# Patient Record
Sex: Female | Born: 1960 | Race: White | Hispanic: No | Marital: Married
Health system: Southern US, Community
[De-identification: ages and names within clinical notes are randomized; demographics above are authoritative.]

---

## 1997-12-11 ENCOUNTER — Ambulatory Visit (HOSPITAL_COMMUNITY): Admission: RE | Admit: 1997-12-11 | Discharge: 1997-12-11 | Payer: Self-pay | Admitting: *Deleted

## 1997-12-15 ENCOUNTER — Ambulatory Visit (HOSPITAL_COMMUNITY): Admission: RE | Admit: 1997-12-15 | Discharge: 1997-12-15 | Payer: Self-pay | Admitting: *Deleted

## 1998-07-08 ENCOUNTER — Other Ambulatory Visit: Admission: RE | Admit: 1998-07-08 | Discharge: 1998-07-08 | Payer: Self-pay | Admitting: *Deleted

## 1999-07-25 ENCOUNTER — Other Ambulatory Visit: Admission: RE | Admit: 1999-07-25 | Discharge: 1999-07-25 | Payer: Self-pay | Admitting: *Deleted

## 1999-08-04 ENCOUNTER — Ambulatory Visit (HOSPITAL_COMMUNITY): Admission: RE | Admit: 1999-08-04 | Discharge: 1999-08-04 | Payer: Self-pay | Admitting: *Deleted

## 1999-08-04 ENCOUNTER — Encounter: Payer: Self-pay | Admitting: *Deleted

## 2000-07-30 ENCOUNTER — Other Ambulatory Visit: Admission: RE | Admit: 2000-07-30 | Discharge: 2000-07-30 | Payer: Self-pay | Admitting: *Deleted

## 2000-11-06 ENCOUNTER — Encounter: Admission: RE | Admit: 2000-11-06 | Discharge: 2000-11-06 | Payer: Self-pay | Admitting: *Deleted

## 2000-11-06 ENCOUNTER — Encounter: Payer: Self-pay | Admitting: *Deleted

## 2001-08-30 ENCOUNTER — Other Ambulatory Visit: Admission: RE | Admit: 2001-08-30 | Discharge: 2001-08-30 | Payer: Self-pay | Admitting: *Deleted

## 2002-08-05 ENCOUNTER — Encounter: Payer: Self-pay | Admitting: Obstetrics and Gynecology

## 2002-08-05 ENCOUNTER — Ambulatory Visit (HOSPITAL_COMMUNITY): Admission: RE | Admit: 2002-08-05 | Discharge: 2002-08-05 | Payer: Self-pay | Admitting: Obstetrics and Gynecology

## 2002-09-04 ENCOUNTER — Other Ambulatory Visit: Admission: RE | Admit: 2002-09-04 | Discharge: 2002-09-04 | Payer: Self-pay | Admitting: Obstetrics and Gynecology

## 2003-08-19 ENCOUNTER — Ambulatory Visit (HOSPITAL_COMMUNITY): Admission: RE | Admit: 2003-08-19 | Discharge: 2003-08-19 | Payer: Self-pay | Admitting: *Deleted

## 2003-08-20 ENCOUNTER — Other Ambulatory Visit: Admission: RE | Admit: 2003-08-20 | Discharge: 2003-08-20 | Payer: Self-pay | Admitting: Obstetrics and Gynecology

## 2004-09-05 ENCOUNTER — Ambulatory Visit (HOSPITAL_COMMUNITY): Admission: RE | Admit: 2004-09-05 | Discharge: 2004-09-05 | Payer: Self-pay | Admitting: *Deleted

## 2005-09-11 ENCOUNTER — Ambulatory Visit (HOSPITAL_COMMUNITY): Admission: RE | Admit: 2005-09-11 | Discharge: 2005-09-11 | Payer: Self-pay | Admitting: Obstetrics and Gynecology

## 2006-12-04 ENCOUNTER — Ambulatory Visit (HOSPITAL_COMMUNITY): Admission: RE | Admit: 2006-12-04 | Discharge: 2006-12-04 | Payer: Self-pay | Admitting: Obstetrics and Gynecology

## 2007-12-05 ENCOUNTER — Ambulatory Visit (HOSPITAL_COMMUNITY): Admission: RE | Admit: 2007-12-05 | Discharge: 2007-12-05 | Payer: Self-pay | Admitting: Obstetrics and Gynecology

## 2008-12-24 ENCOUNTER — Ambulatory Visit (HOSPITAL_COMMUNITY): Admission: RE | Admit: 2008-12-24 | Discharge: 2008-12-24 | Payer: Self-pay | Admitting: Obstetrics and Gynecology

## 2013-07-30 ENCOUNTER — Other Ambulatory Visit (HOSPITAL_COMMUNITY): Payer: Self-pay | Admitting: Obstetrics and Gynecology

## 2013-07-30 DIAGNOSIS — Z1231 Encounter for screening mammogram for malignant neoplasm of breast: Secondary | ICD-10-CM

## 2013-08-19 ENCOUNTER — Ambulatory Visit (HOSPITAL_COMMUNITY)
Admission: RE | Admit: 2013-08-19 | Discharge: 2013-08-19 | Disposition: A | Discharge status: Home / Self Care | Payer: 59 | Source: Ambulatory Visit | Attending: Obstetrics and Gynecology | Admitting: Obstetrics and Gynecology

## 2013-08-19 DIAGNOSIS — Z1231 Encounter for screening mammogram for malignant neoplasm of breast: Secondary | ICD-10-CM | POA: Insufficient documentation

## 2019-05-01 ENCOUNTER — Other Ambulatory Visit: Payer: Self-pay | Admitting: Family Medicine

## 2019-05-01 ENCOUNTER — Other Ambulatory Visit: Payer: Self-pay

## 2019-05-01 ENCOUNTER — Ambulatory Visit
Admission: RE | Admit: 2019-05-01 | Discharge: 2019-05-01 | Disposition: A | Discharge status: Home / Self Care | Payer: BC Managed Care – PPO | Source: Ambulatory Visit | Attending: Family Medicine | Admitting: Family Medicine

## 2019-05-01 DIAGNOSIS — M159 Polyosteoarthritis, unspecified: Secondary | ICD-10-CM

## 2020-03-17 ENCOUNTER — Other Ambulatory Visit: Payer: Self-pay | Admitting: Family Medicine

## 2020-03-17 ENCOUNTER — Ambulatory Visit
Admission: RE | Admit: 2020-03-17 | Discharge: 2020-03-17 | Disposition: A | Discharge status: Home / Self Care | Payer: BC Managed Care – PPO | Source: Ambulatory Visit | Attending: Family Medicine | Admitting: Family Medicine

## 2020-03-17 DIAGNOSIS — R0989 Other specified symptoms and signs involving the circulatory and respiratory systems: Secondary | ICD-10-CM

## 2020-09-14 ENCOUNTER — Other Ambulatory Visit: Payer: Self-pay | Admitting: Chiropractic Medicine

## 2020-09-14 DIAGNOSIS — M5416 Radiculopathy, lumbar region: Secondary | ICD-10-CM

## 2020-09-14 DIAGNOSIS — M545 Low back pain, unspecified: Secondary | ICD-10-CM

## 2020-10-09 ENCOUNTER — Other Ambulatory Visit: Payer: Self-pay

## 2020-10-09 ENCOUNTER — Ambulatory Visit
Admission: RE | Admit: 2020-10-09 | Discharge: 2020-10-09 | Disposition: A | Discharge status: Home / Self Care | Payer: BC Managed Care – PPO | Source: Ambulatory Visit | Attending: Chiropractic Medicine | Admitting: Chiropractic Medicine

## 2020-10-09 DIAGNOSIS — M545 Low back pain, unspecified: Secondary | ICD-10-CM

## 2020-10-09 DIAGNOSIS — M5416 Radiculopathy, lumbar region: Secondary | ICD-10-CM

## 2021-05-17 IMAGING — MR MR LUMBAR SPINE W/O CM
4 of 5 series · 24 of 48 positions shown · non-contrast
Comparison: None available.

CLINICAL DATA: Initial evaluation for low back pain with numbness
primarily involving the left lower extremity for several months.

EXAM:
MRI LUMBAR SPINE WITHOUT CONTRAST
TECHNIQUE: Multiplanar, multisequence MR imaging of the lumbar spine was
performed. No intravenous contrast was administered.

[Series 2: T2 post-contrast · sagittal · 4.0mm · 0.53mm/px · 6 of 15 slices shown]
[im 1/15]
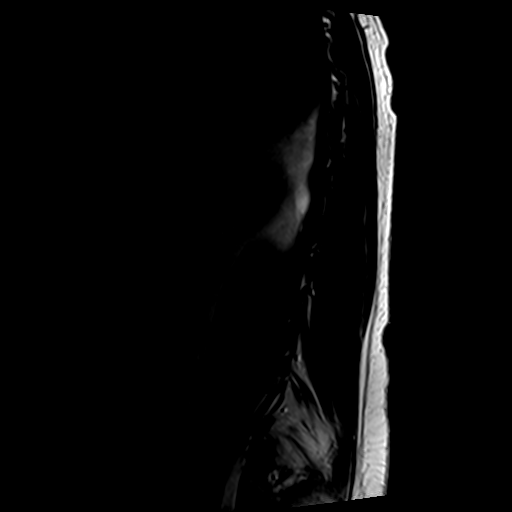
[im 3/15]
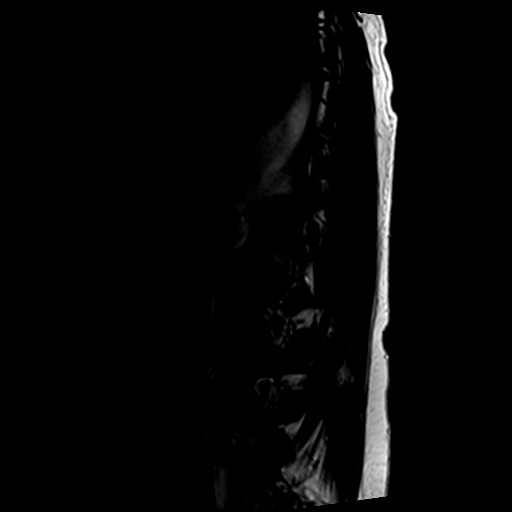
[im 6/15]
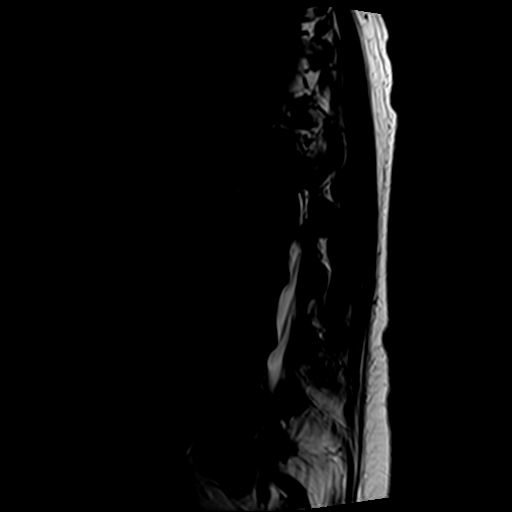
[im 9/15]
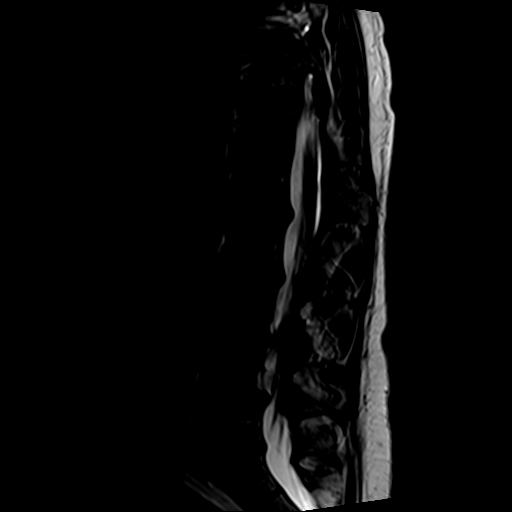
[im 12/15]
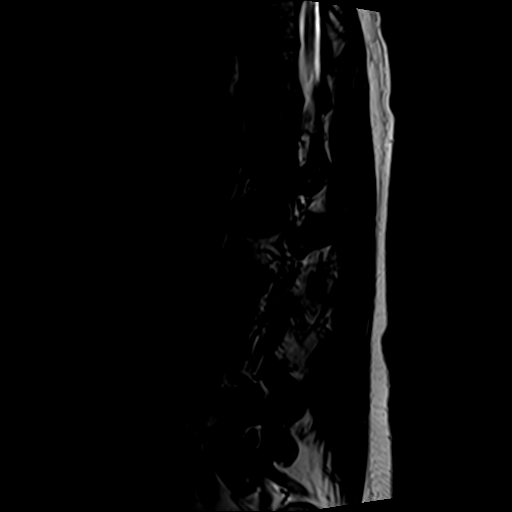
[im 15/15]
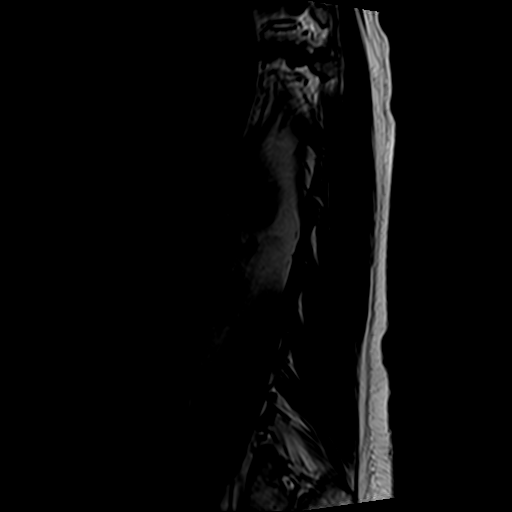

[Series 4: T1 · sagittal · 4.0mm · 0.53mm/px · 6 of 15 slices shown (1 of 2)]
[im 1/15]
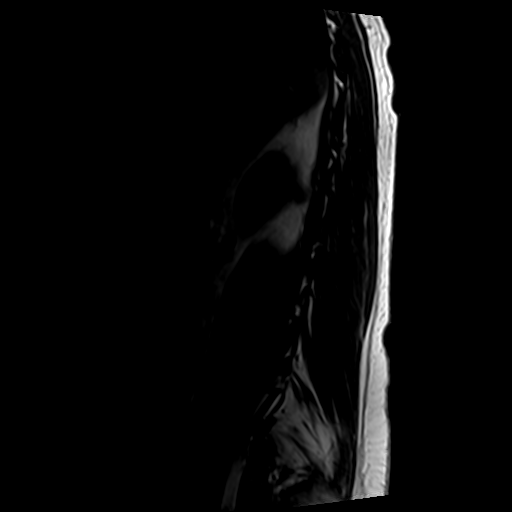
[im 3/15]
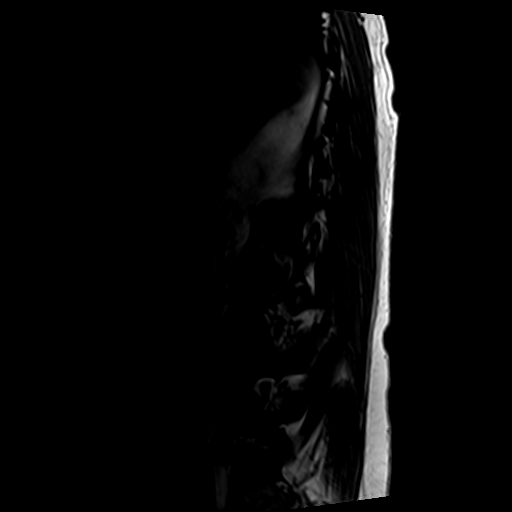
[im 6/15]
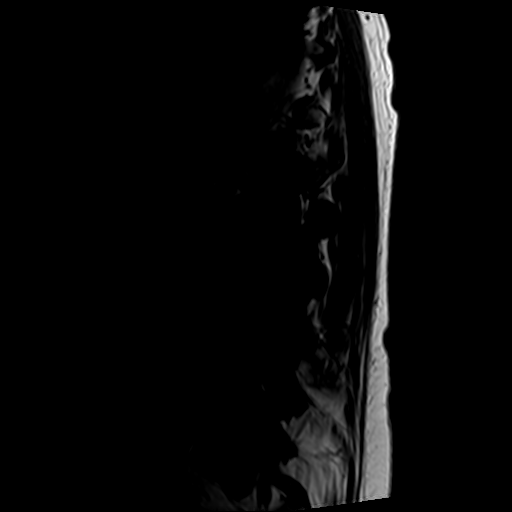
[im 9/15]
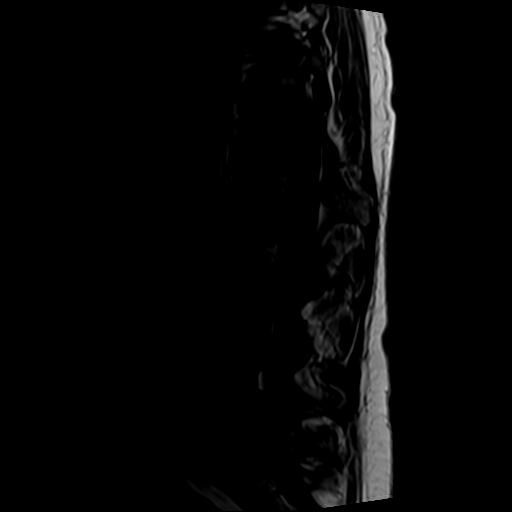
[im 12/15]
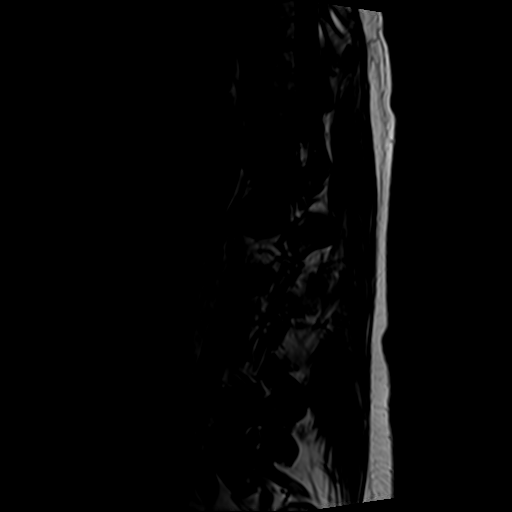
[im 15/15]
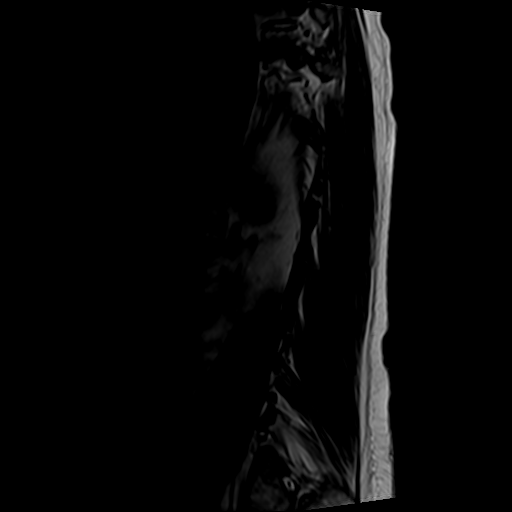

[Series 5: T2 · axial · 4.0mm · 0.70mm/px · z∈[-121,+83]mm · 9 of 37 slices shown]
[im 1/37]
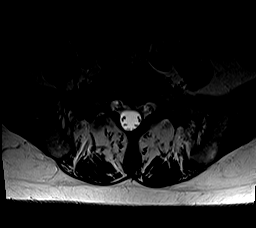
[im 6/37]
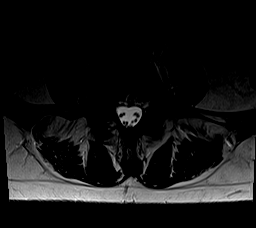
[im 11/37]
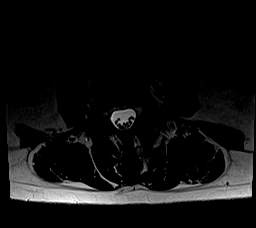
[im 16/37]
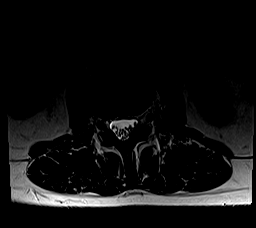
[im 19/37]
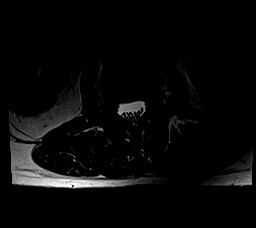
[im 21/37]
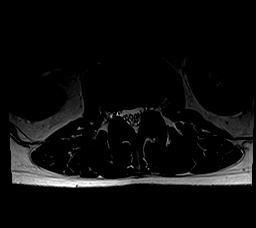
[im 26/37]
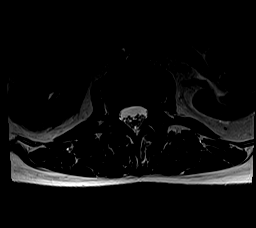
[im 31/37]
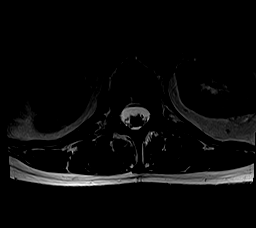
[im 37/37]
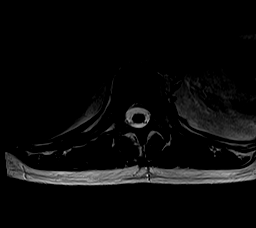

[Series 6: T1 · axial · 4.0mm · 0.35mm/px · z∈[-95,+52]mm · 3 of 37 slices shown (2 of 2)]
[im 6/37]
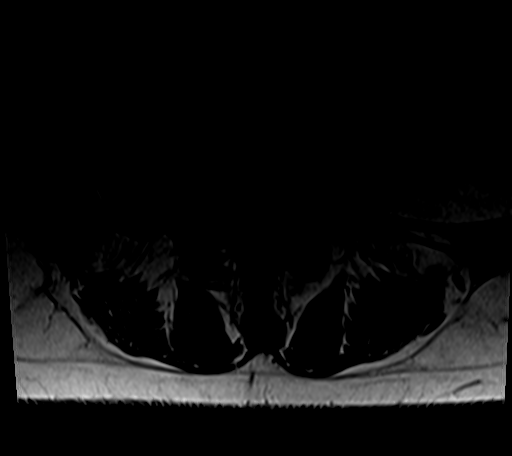
[im 19/37]
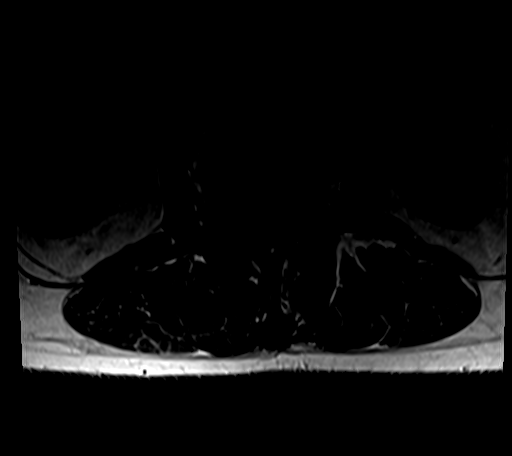
[im 31/37]
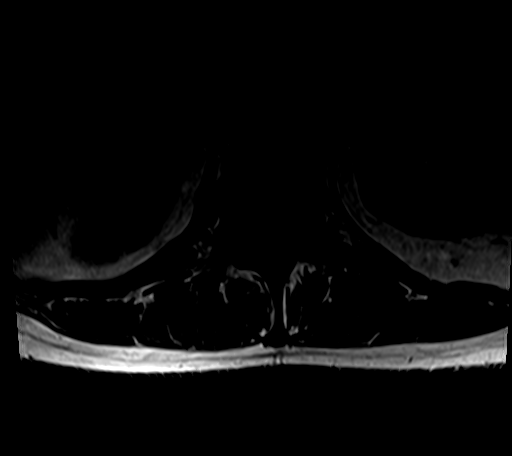

[24 of 48 positions shown; findings below may reference images not displayed]

FINDINGS: Segmentation: Standard. Lowest well-formed disc space labeled the
L5-S1 level.

Alignment: Dextroscoliosis with straightening of the normal lumbar
lordosis. 3 mm anterolisthesis of L4 on L5, chronic and facet
mediated.

Vertebrae: Vertebral body height maintained without acute or chronic
fracture. Bone marrow signal intensity mildly heterogeneous but
within normal limits. Subcentimeter benign hemangioma noted within
the L3 vertebral body. No worrisome osseous lesions. Discogenic
reactive endplate change present about the L4-5 interspace. No
abnormal marrow edema.

Conus medullaris and cauda equina: Conus extends to the L1-2 level.
Conus and cauda equina appear normal.

Paraspinal and other soft tissues: Paraspinous soft tissues within
normal limits. 1.3 cm simple exophytic cyst seen extending from the
interpolar right kidney. Visualized visceral structures otherwise
unremarkable.

Disc levels:

L1-2: Diffuse disc bulge with disc desiccation. No spinal stenosis.
Foramina remain patent.

L2-3: Circumferential disc bulge with disc desiccation. Superimposed
small central disc protrusion mildly indents the ventral thecal sac.
Mild bilateral facet hypertrophy. No significant spinal stenosis.
Foramina remain patent.

L3-4: Circumferential disc bulge with disc desiccation. Superimposed
shallow left subarticular disc protrusion partially effaces the left
lateral recess (series 5, image 23). Superimposed mild facet
hypertrophy. Resultant mild to moderate narrowing of the left
lateral recess, potentially affecting the descending left L4 nerve
root. Central canal remains patent. No significant foraminal
encroachment.

L4-5: Advanced degenerative intervertebral disc space narrowing with
disc desiccation and diffuse disc bulge. Associated discogenic
reactive endplate change with marginal endplate osteophytic
spurring, greater on the left. Moderate left greater than right
facet hypertrophy. Resultant moderate narrowing of the left lateral
recess. Mild right with mild to moderate left L4 foraminal
narrowing.

L5-S1: Mild disc bulge with disc desiccation and intervertebral disc
space narrowing. Associated reactive endplate changes on the right.
Bulging disc mildly indents the ventral thecal sac mild right
greater than left facet hypertrophy. Tiny synovial cyst noted at the
posterior aspects of both L5-S1 facets. No spinal stenosis. Moderate
right L5 foraminal narrowing. Left neural foramina remains patent.
IMPRESSION: 1. Small left subarticular disc protrusion at L3-4 with resultant
mild to moderate narrowing of the left lateral recess, potentially
affecting the descending left L4 nerve root.
2. Left eccentric disc osteophyte with facet hypertrophy at L4-5,
resulting in moderate left foraminal and left lateral recess
stenosis.
3. Moderate right L5 foraminal stenosis related to disc bulge,
reactive endplate changes, and facet degeneration.

## 2021-06-14 ENCOUNTER — Other Ambulatory Visit: Payer: Self-pay | Admitting: Family Medicine

## 2021-06-14 DIAGNOSIS — I6522 Occlusion and stenosis of left carotid artery: Secondary | ICD-10-CM

## 2021-06-24 ENCOUNTER — Other Ambulatory Visit: Payer: BC Managed Care – PPO

## 2021-06-27 ENCOUNTER — Ambulatory Visit
Admission: RE | Admit: 2021-06-27 | Discharge: 2021-06-27 | Disposition: A | Discharge status: Home / Self Care | Payer: BC Managed Care – PPO | Source: Ambulatory Visit | Attending: Family Medicine | Admitting: Family Medicine

## 2021-06-27 DIAGNOSIS — I6522 Occlusion and stenosis of left carotid artery: Secondary | ICD-10-CM

## 2022-02-02 IMAGING — US US CAROTID DUPLEX BILAT
1 series · 13 of 24 positions shown · non-contrast
Comparison: March 2020

CLINICAL DATA: Atherosclerosis of left carotid artery

EXAM:
BILATERAL CAROTID DUPLEX ULTRASOUND
TECHNIQUE: Gray scale imaging, color Doppler and duplex ultrasound were
performed of bilateral carotid and vertebral arteries in the neck.

[Series 1: us carotid duplex bilat · 0.06mm/px · 13 of 62 slices shown]
[im 1/62]
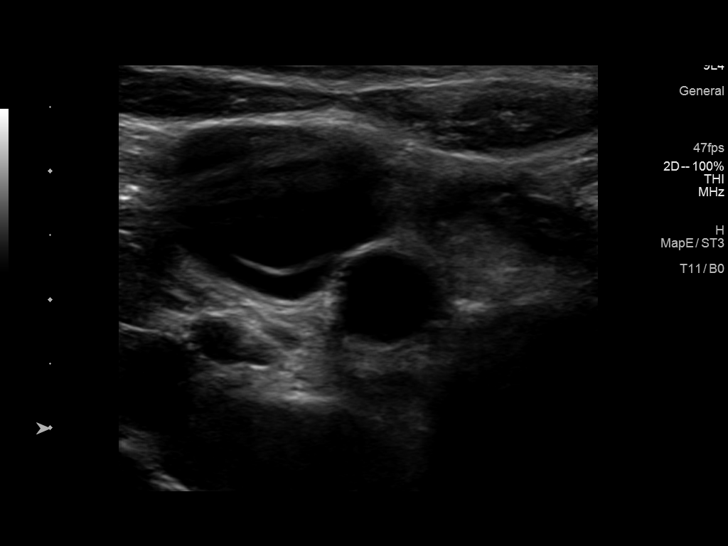
[im 6/62]
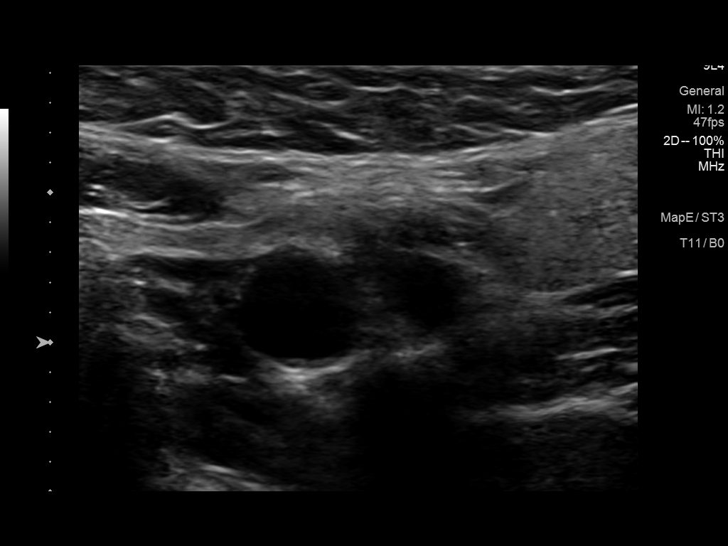
[im 11/62]
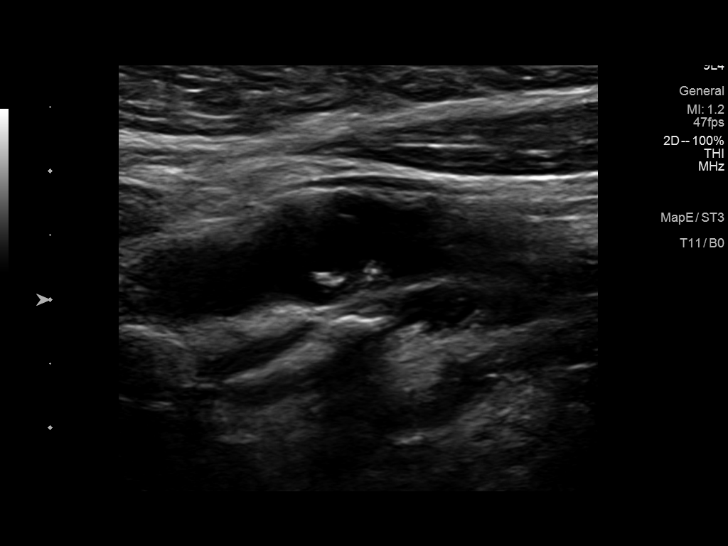
[im 16/62]
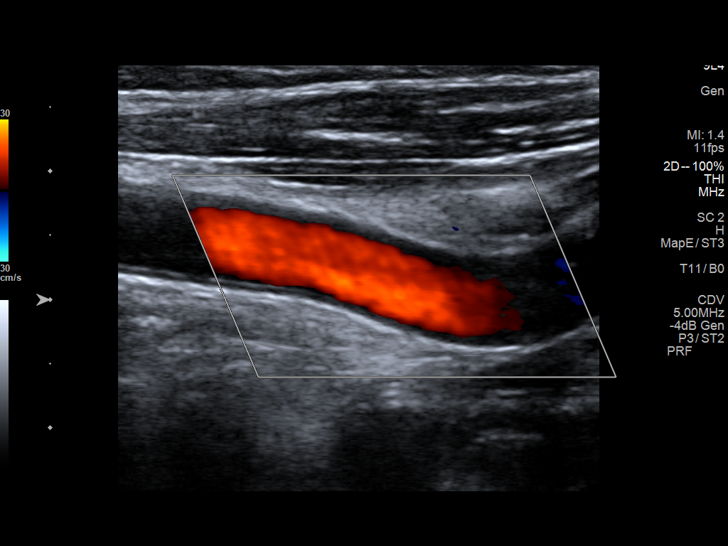
[im 22/62]
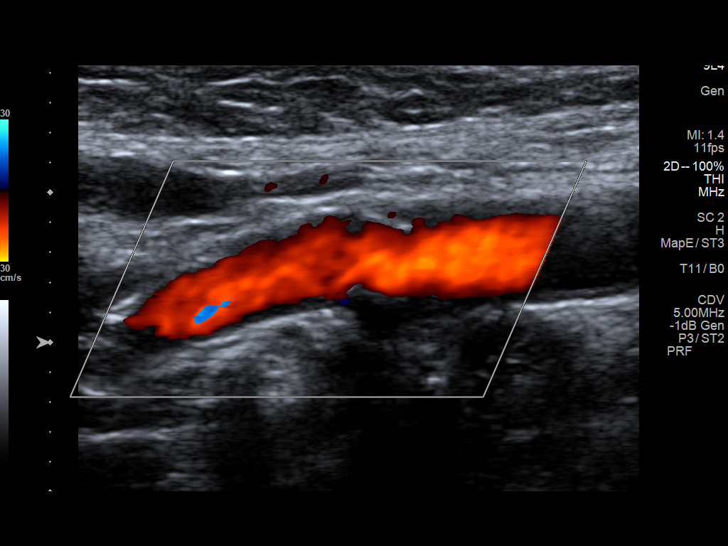
[im 27/62]
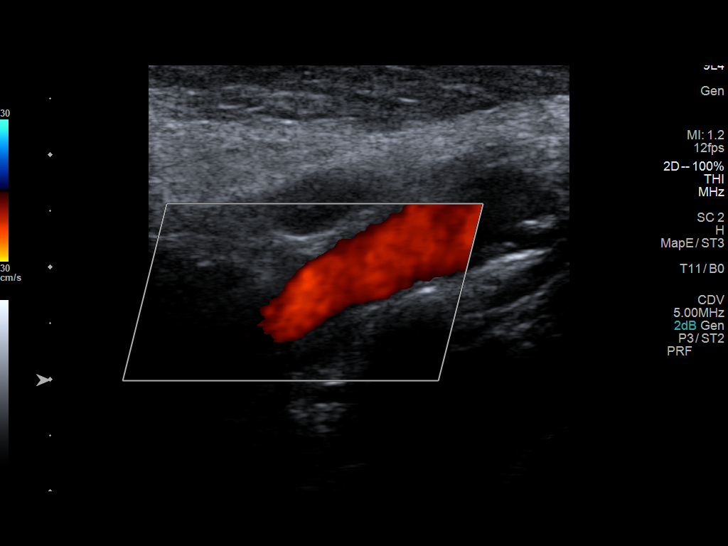
[im 32/62]
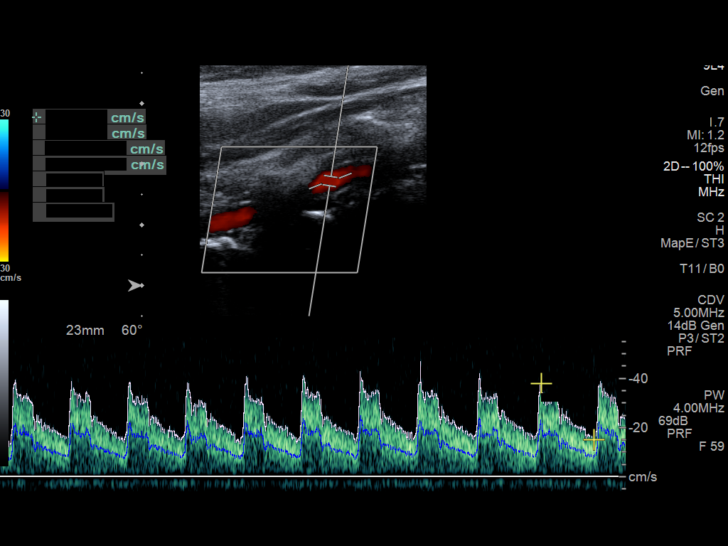
[im 35/62]
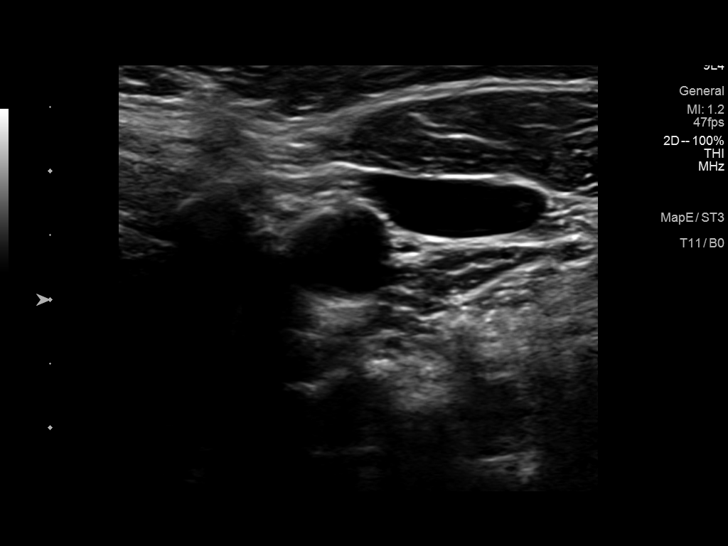
[im 40/62]
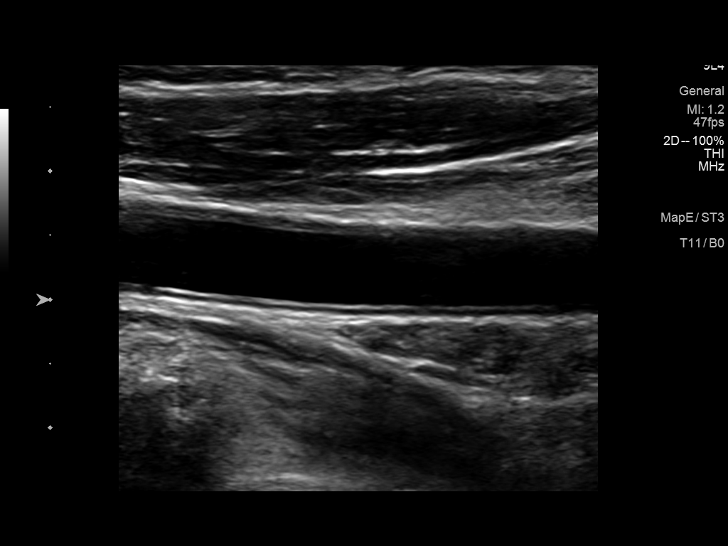
[im 46/62]
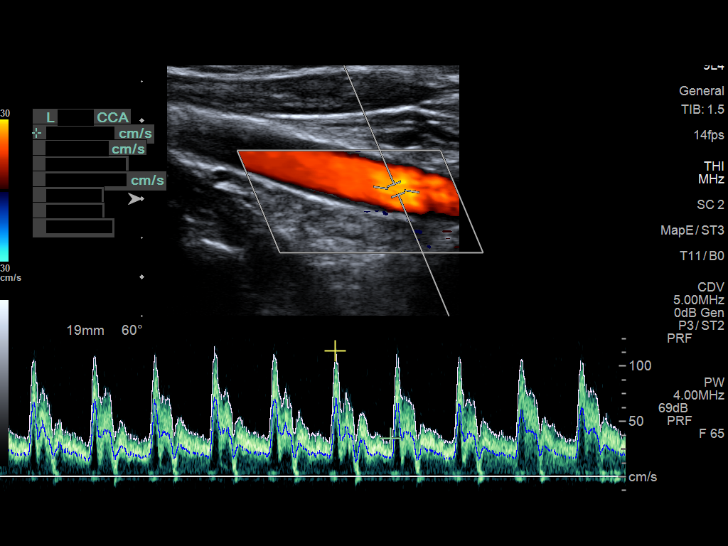
[im 51/62]
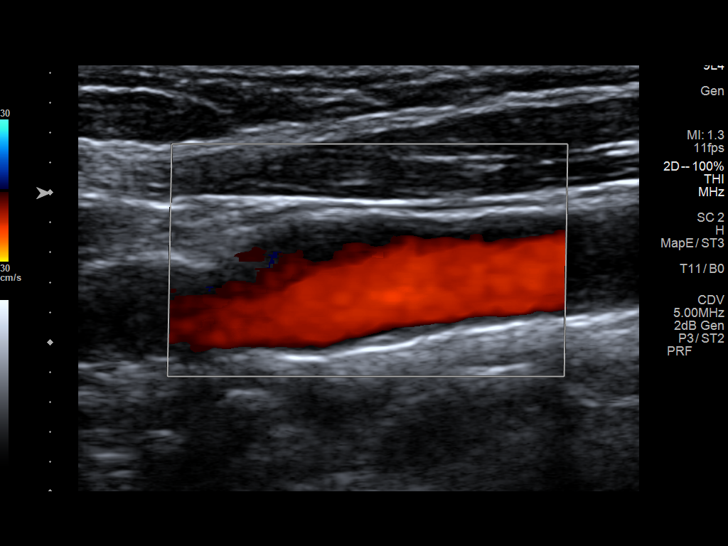
[im 56/62]
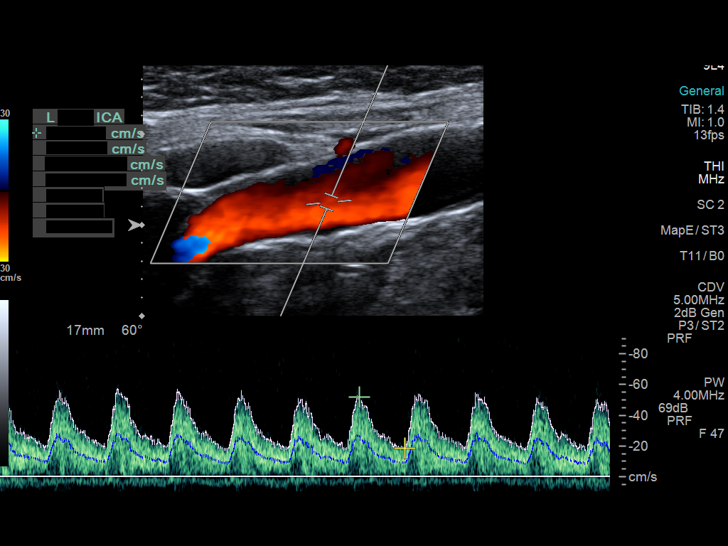
[im 62/62]
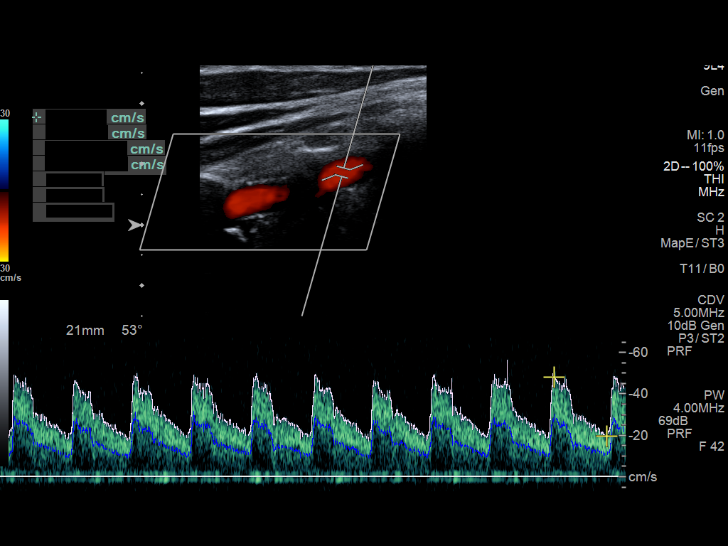

[13 of 24 positions shown; findings below may reference images not displayed]

FINDINGS: Criteria: Quantification of carotid stenosis is based on velocity
parameters that correlate the residual internal carotid diameter
with NASCET-based stenosis levels, using the diameter of the distal
internal carotid lumen as the denominator for stenosis measurement.

The following velocity measurements were obtained:

RIGHT

ICA: 87/29 cm/sec

CCA: 106/35 cm/sec

SYSTOLIC ICA/CCA RATIO:

ECA: 75 cm/sec

LEFT

ICA: 63/32 cm/sec

CCA: 114/35 cm/sec

SYSTOLIC ICA/CCA RATIO:

ECA: 68 cm/sec

RIGHT CAROTID ARTERY: Moderate atherosclerotic plaque in the carotid
bulb results in less than 50% stenosis. Normal low resistance
waveform in the internal carotid artery.

RIGHT VERTEBRAL ARTERY:  Antegrade flow

LEFT CAROTID ARTERY: Minimal atherosclerotic plaque appreciated. No
significant stenosis. Normal low resistance waveform in the internal
carotid artery.

LEFT VERTEBRAL ARTERY:  Antegrade flow

Upper extremity blood pressures: RIGHT: 113/73 LEFT: 111/66
IMPRESSION: No evidence of hemodynamically significant stenosis involving either
the right or left carotid circulation in the neck by Doppler
criteria. Moderate atherosclerotic plaque in the right carotid bulb
results in less than 50% stenosis. Minimal atherosclerotic plaque
appreciated in the left carotid circulation.

## 2022-07-04 DIAGNOSIS — M9902 Segmental and somatic dysfunction of thoracic region: Secondary | ICD-10-CM | POA: Diagnosis not present

## 2022-07-04 DIAGNOSIS — M542 Cervicalgia: Secondary | ICD-10-CM | POA: Diagnosis not present

## 2022-07-04 DIAGNOSIS — M4712 Other spondylosis with myelopathy, cervical region: Secondary | ICD-10-CM | POA: Diagnosis not present

## 2022-07-04 DIAGNOSIS — M9901 Segmental and somatic dysfunction of cervical region: Secondary | ICD-10-CM | POA: Diagnosis not present

## 2022-07-06 DIAGNOSIS — M4712 Other spondylosis with myelopathy, cervical region: Secondary | ICD-10-CM | POA: Diagnosis not present

## 2022-07-06 DIAGNOSIS — M9901 Segmental and somatic dysfunction of cervical region: Secondary | ICD-10-CM | POA: Diagnosis not present

## 2022-07-06 DIAGNOSIS — M9902 Segmental and somatic dysfunction of thoracic region: Secondary | ICD-10-CM | POA: Diagnosis not present

## 2022-07-06 DIAGNOSIS — M542 Cervicalgia: Secondary | ICD-10-CM | POA: Diagnosis not present

## 2022-07-12 DIAGNOSIS — M9903 Segmental and somatic dysfunction of lumbar region: Secondary | ICD-10-CM | POA: Diagnosis not present

## 2022-07-12 DIAGNOSIS — M62830 Muscle spasm of back: Secondary | ICD-10-CM | POA: Diagnosis not present

## 2022-07-12 DIAGNOSIS — M9904 Segmental and somatic dysfunction of sacral region: Secondary | ICD-10-CM | POA: Diagnosis not present

## 2022-07-12 DIAGNOSIS — M9905 Segmental and somatic dysfunction of pelvic region: Secondary | ICD-10-CM | POA: Diagnosis not present

## 2022-07-13 DIAGNOSIS — M9901 Segmental and somatic dysfunction of cervical region: Secondary | ICD-10-CM | POA: Diagnosis not present

## 2022-07-13 DIAGNOSIS — M9902 Segmental and somatic dysfunction of thoracic region: Secondary | ICD-10-CM | POA: Diagnosis not present

## 2022-07-13 DIAGNOSIS — M4712 Other spondylosis with myelopathy, cervical region: Secondary | ICD-10-CM | POA: Diagnosis not present

## 2022-07-13 DIAGNOSIS — M542 Cervicalgia: Secondary | ICD-10-CM | POA: Diagnosis not present

## 2022-07-20 DIAGNOSIS — M9901 Segmental and somatic dysfunction of cervical region: Secondary | ICD-10-CM | POA: Diagnosis not present

## 2022-07-20 DIAGNOSIS — M4712 Other spondylosis with myelopathy, cervical region: Secondary | ICD-10-CM | POA: Diagnosis not present

## 2022-07-20 DIAGNOSIS — M542 Cervicalgia: Secondary | ICD-10-CM | POA: Diagnosis not present

## 2022-07-20 DIAGNOSIS — M9902 Segmental and somatic dysfunction of thoracic region: Secondary | ICD-10-CM | POA: Diagnosis not present

## 2022-07-26 DIAGNOSIS — M9904 Segmental and somatic dysfunction of sacral region: Secondary | ICD-10-CM | POA: Diagnosis not present

## 2022-07-26 DIAGNOSIS — M62830 Muscle spasm of back: Secondary | ICD-10-CM | POA: Diagnosis not present

## 2022-07-26 DIAGNOSIS — M9905 Segmental and somatic dysfunction of pelvic region: Secondary | ICD-10-CM | POA: Diagnosis not present

## 2022-07-26 DIAGNOSIS — M9903 Segmental and somatic dysfunction of lumbar region: Secondary | ICD-10-CM | POA: Diagnosis not present

## 2022-08-02 DIAGNOSIS — M9904 Segmental and somatic dysfunction of sacral region: Secondary | ICD-10-CM | POA: Diagnosis not present

## 2022-08-02 DIAGNOSIS — M62830 Muscle spasm of back: Secondary | ICD-10-CM | POA: Diagnosis not present

## 2022-08-02 DIAGNOSIS — M9903 Segmental and somatic dysfunction of lumbar region: Secondary | ICD-10-CM | POA: Diagnosis not present

## 2022-08-02 DIAGNOSIS — M9905 Segmental and somatic dysfunction of pelvic region: Secondary | ICD-10-CM | POA: Diagnosis not present

## 2022-08-09 DIAGNOSIS — M9904 Segmental and somatic dysfunction of sacral region: Secondary | ICD-10-CM | POA: Diagnosis not present

## 2022-08-09 DIAGNOSIS — M9905 Segmental and somatic dysfunction of pelvic region: Secondary | ICD-10-CM | POA: Diagnosis not present

## 2022-08-09 DIAGNOSIS — M62830 Muscle spasm of back: Secondary | ICD-10-CM | POA: Diagnosis not present

## 2022-08-09 DIAGNOSIS — M9903 Segmental and somatic dysfunction of lumbar region: Secondary | ICD-10-CM | POA: Diagnosis not present

## 2022-08-16 DIAGNOSIS — M9903 Segmental and somatic dysfunction of lumbar region: Secondary | ICD-10-CM | POA: Diagnosis not present

## 2022-08-16 DIAGNOSIS — M9905 Segmental and somatic dysfunction of pelvic region: Secondary | ICD-10-CM | POA: Diagnosis not present

## 2022-08-16 DIAGNOSIS — M9904 Segmental and somatic dysfunction of sacral region: Secondary | ICD-10-CM | POA: Diagnosis not present

## 2022-08-16 DIAGNOSIS — M62830 Muscle spasm of back: Secondary | ICD-10-CM | POA: Diagnosis not present

## 2022-08-23 DIAGNOSIS — M9904 Segmental and somatic dysfunction of sacral region: Secondary | ICD-10-CM | POA: Diagnosis not present

## 2022-08-23 DIAGNOSIS — M9903 Segmental and somatic dysfunction of lumbar region: Secondary | ICD-10-CM | POA: Diagnosis not present

## 2022-08-23 DIAGNOSIS — M9905 Segmental and somatic dysfunction of pelvic region: Secondary | ICD-10-CM | POA: Diagnosis not present

## 2022-08-23 DIAGNOSIS — M62830 Muscle spasm of back: Secondary | ICD-10-CM | POA: Diagnosis not present

## 2022-08-30 DIAGNOSIS — M9904 Segmental and somatic dysfunction of sacral region: Secondary | ICD-10-CM | POA: Diagnosis not present

## 2022-08-30 DIAGNOSIS — M9903 Segmental and somatic dysfunction of lumbar region: Secondary | ICD-10-CM | POA: Diagnosis not present

## 2022-08-30 DIAGNOSIS — M62830 Muscle spasm of back: Secondary | ICD-10-CM | POA: Diagnosis not present

## 2022-08-30 DIAGNOSIS — M9905 Segmental and somatic dysfunction of pelvic region: Secondary | ICD-10-CM | POA: Diagnosis not present

## 2022-09-07 DIAGNOSIS — M9902 Segmental and somatic dysfunction of thoracic region: Secondary | ICD-10-CM | POA: Diagnosis not present

## 2022-09-07 DIAGNOSIS — M9901 Segmental and somatic dysfunction of cervical region: Secondary | ICD-10-CM | POA: Diagnosis not present

## 2022-09-07 DIAGNOSIS — M4712 Other spondylosis with myelopathy, cervical region: Secondary | ICD-10-CM | POA: Diagnosis not present

## 2022-09-07 DIAGNOSIS — M542 Cervicalgia: Secondary | ICD-10-CM | POA: Diagnosis not present

## 2022-09-11 DIAGNOSIS — M9904 Segmental and somatic dysfunction of sacral region: Secondary | ICD-10-CM | POA: Diagnosis not present

## 2022-09-11 DIAGNOSIS — M9905 Segmental and somatic dysfunction of pelvic region: Secondary | ICD-10-CM | POA: Diagnosis not present

## 2022-09-11 DIAGNOSIS — M9903 Segmental and somatic dysfunction of lumbar region: Secondary | ICD-10-CM | POA: Diagnosis not present

## 2022-09-11 DIAGNOSIS — M62830 Muscle spasm of back: Secondary | ICD-10-CM | POA: Diagnosis not present

## 2022-09-14 DIAGNOSIS — M542 Cervicalgia: Secondary | ICD-10-CM | POA: Diagnosis not present

## 2022-09-14 DIAGNOSIS — M9901 Segmental and somatic dysfunction of cervical region: Secondary | ICD-10-CM | POA: Diagnosis not present

## 2022-09-14 DIAGNOSIS — M9902 Segmental and somatic dysfunction of thoracic region: Secondary | ICD-10-CM | POA: Diagnosis not present

## 2022-09-14 DIAGNOSIS — M4712 Other spondylosis with myelopathy, cervical region: Secondary | ICD-10-CM | POA: Diagnosis not present

## 2022-09-19 DIAGNOSIS — M9903 Segmental and somatic dysfunction of lumbar region: Secondary | ICD-10-CM | POA: Diagnosis not present

## 2022-09-19 DIAGNOSIS — M9905 Segmental and somatic dysfunction of pelvic region: Secondary | ICD-10-CM | POA: Diagnosis not present

## 2022-09-19 DIAGNOSIS — M9904 Segmental and somatic dysfunction of sacral region: Secondary | ICD-10-CM | POA: Diagnosis not present

## 2022-09-19 DIAGNOSIS — M62830 Muscle spasm of back: Secondary | ICD-10-CM | POA: Diagnosis not present

## 2022-09-21 DIAGNOSIS — M9903 Segmental and somatic dysfunction of lumbar region: Secondary | ICD-10-CM | POA: Diagnosis not present

## 2022-09-21 DIAGNOSIS — M9904 Segmental and somatic dysfunction of sacral region: Secondary | ICD-10-CM | POA: Diagnosis not present

## 2022-09-21 DIAGNOSIS — M9905 Segmental and somatic dysfunction of pelvic region: Secondary | ICD-10-CM | POA: Diagnosis not present

## 2022-09-21 DIAGNOSIS — M62830 Muscle spasm of back: Secondary | ICD-10-CM | POA: Diagnosis not present

## 2022-09-28 DIAGNOSIS — M9903 Segmental and somatic dysfunction of lumbar region: Secondary | ICD-10-CM | POA: Diagnosis not present

## 2022-09-28 DIAGNOSIS — M9904 Segmental and somatic dysfunction of sacral region: Secondary | ICD-10-CM | POA: Diagnosis not present

## 2022-09-28 DIAGNOSIS — M62830 Muscle spasm of back: Secondary | ICD-10-CM | POA: Diagnosis not present

## 2022-09-28 DIAGNOSIS — M9905 Segmental and somatic dysfunction of pelvic region: Secondary | ICD-10-CM | POA: Diagnosis not present

## 2022-10-03 DIAGNOSIS — M62830 Muscle spasm of back: Secondary | ICD-10-CM | POA: Diagnosis not present

## 2022-10-03 DIAGNOSIS — M9905 Segmental and somatic dysfunction of pelvic region: Secondary | ICD-10-CM | POA: Diagnosis not present

## 2022-10-03 DIAGNOSIS — M9903 Segmental and somatic dysfunction of lumbar region: Secondary | ICD-10-CM | POA: Diagnosis not present

## 2022-10-03 DIAGNOSIS — M9904 Segmental and somatic dysfunction of sacral region: Secondary | ICD-10-CM | POA: Diagnosis not present

## 2022-10-12 DIAGNOSIS — M542 Cervicalgia: Secondary | ICD-10-CM | POA: Diagnosis not present

## 2022-10-12 DIAGNOSIS — M9901 Segmental and somatic dysfunction of cervical region: Secondary | ICD-10-CM | POA: Diagnosis not present

## 2022-10-12 DIAGNOSIS — M4712 Other spondylosis with myelopathy, cervical region: Secondary | ICD-10-CM | POA: Diagnosis not present

## 2022-10-12 DIAGNOSIS — M9902 Segmental and somatic dysfunction of thoracic region: Secondary | ICD-10-CM | POA: Diagnosis not present

## 2022-10-19 DIAGNOSIS — M9901 Segmental and somatic dysfunction of cervical region: Secondary | ICD-10-CM | POA: Diagnosis not present

## 2022-10-19 DIAGNOSIS — M9902 Segmental and somatic dysfunction of thoracic region: Secondary | ICD-10-CM | POA: Diagnosis not present

## 2022-10-19 DIAGNOSIS — M542 Cervicalgia: Secondary | ICD-10-CM | POA: Diagnosis not present

## 2022-10-19 DIAGNOSIS — M4712 Other spondylosis with myelopathy, cervical region: Secondary | ICD-10-CM | POA: Diagnosis not present

## 2022-10-31 DIAGNOSIS — M9904 Segmental and somatic dysfunction of sacral region: Secondary | ICD-10-CM | POA: Diagnosis not present

## 2022-10-31 DIAGNOSIS — M62830 Muscle spasm of back: Secondary | ICD-10-CM | POA: Diagnosis not present

## 2022-10-31 DIAGNOSIS — M9905 Segmental and somatic dysfunction of pelvic region: Secondary | ICD-10-CM | POA: Diagnosis not present

## 2022-10-31 DIAGNOSIS — M9903 Segmental and somatic dysfunction of lumbar region: Secondary | ICD-10-CM | POA: Diagnosis not present

## 2022-11-14 DIAGNOSIS — M62830 Muscle spasm of back: Secondary | ICD-10-CM | POA: Diagnosis not present

## 2022-11-14 DIAGNOSIS — M9903 Segmental and somatic dysfunction of lumbar region: Secondary | ICD-10-CM | POA: Diagnosis not present

## 2022-11-14 DIAGNOSIS — M9904 Segmental and somatic dysfunction of sacral region: Secondary | ICD-10-CM | POA: Diagnosis not present

## 2022-11-14 DIAGNOSIS — M9905 Segmental and somatic dysfunction of pelvic region: Secondary | ICD-10-CM | POA: Diagnosis not present

## 2022-11-30 DIAGNOSIS — M9903 Segmental and somatic dysfunction of lumbar region: Secondary | ICD-10-CM | POA: Diagnosis not present

## 2022-11-30 DIAGNOSIS — M62830 Muscle spasm of back: Secondary | ICD-10-CM | POA: Diagnosis not present

## 2022-11-30 DIAGNOSIS — M9904 Segmental and somatic dysfunction of sacral region: Secondary | ICD-10-CM | POA: Diagnosis not present

## 2022-11-30 DIAGNOSIS — M9905 Segmental and somatic dysfunction of pelvic region: Secondary | ICD-10-CM | POA: Diagnosis not present
